# Patient Record
Sex: Female | Born: 1970 | Race: White | Hispanic: No | Marital: Married | Smoking: Never smoker
Health system: Southern US, Community
[De-identification: ages and names within clinical notes are randomized; demographics above are authoritative.]

## PROBLEM LIST (undated history)

## (undated) DIAGNOSIS — J45909 Unspecified asthma, uncomplicated: Secondary | ICD-10-CM

## (undated) DIAGNOSIS — I8390 Asymptomatic varicose veins of unspecified lower extremity: Secondary | ICD-10-CM

## (undated) HISTORY — PX: ENDOVENOUS ABLATION SAPHENOUS VEIN W/ LASER: SUR449

## (undated) HISTORY — PX: WISDOM TOOTH EXTRACTION: SHX21

---

## 2018-03-05 ENCOUNTER — Emergency Department (INDEPENDENT_AMBULATORY_CARE_PROVIDER_SITE_OTHER)
Admission: EM | Admit: 2018-03-05 | Discharge: 2018-03-05 | Disposition: A | Discharge status: Home / Self Care | Payer: BLUE CROSS/BLUE SHIELD | Source: Home / Self Care

## 2018-03-05 ENCOUNTER — Emergency Department (INDEPENDENT_AMBULATORY_CARE_PROVIDER_SITE_OTHER): Payer: BLUE CROSS/BLUE SHIELD

## 2018-03-05 ENCOUNTER — Encounter: Payer: Self-pay | Admitting: *Deleted

## 2018-03-05 ENCOUNTER — Other Ambulatory Visit: Payer: Self-pay

## 2018-03-05 DIAGNOSIS — S8001XA Contusion of right knee, initial encounter: Secondary | ICD-10-CM | POA: Diagnosis not present

## 2018-03-05 DIAGNOSIS — M25561 Pain in right knee: Secondary | ICD-10-CM

## 2018-03-05 HISTORY — DX: Asymptomatic varicose veins of unspecified lower extremity: I83.90

## 2018-03-05 HISTORY — DX: Unspecified asthma, uncomplicated: J45.909

## 2018-03-05 NOTE — Discharge Instructions (Signed)
See your Orthopaedist for recheck if pain persist past one week.  Ice to area of swelling.  Elevate.

## 2018-03-05 NOTE — ED Triage Notes (Signed)
Pt c/o RT knee injury post fall today at 1300. Last dose IBF at 1830 today.

## 2018-03-06 NOTE — ED Provider Notes (Signed)
[facility]    CSN: [id number] Arrival date & time: 03/05/18  1845     History   Chief Complaint Chief Complaint  Patient presents with  . Knee Injury    HPI [facility] is a 47 y.o. female.   The history is provided by the patient. No language interpreter was used.  Fall  This is a new problem. The current episode started 1 to 2 hours ago. The problem occurs constantly. The problem has not changed since onset.The symptoms are aggravated by walking. Nothing relieves the symptoms. She has tried nothing for the symptoms. The treatment provided no relief.  Pt fell. Pt reports she hit her right knee and both hands.  Pt complains of severe pain to right knee,  Pt is bruised and swollen.  Pt scraped up both hands.  Pt reports hands are sore   Past Medical History:  Diagnosis Date  . Asthma   . Varicose vein of leg     There are no active problems to display for this patient.   Past Surgical History:  Procedure Laterality Date  . CESAREAN SECTION    . ENDOVENOUS ABLATION SAPHENOUS VEIN W/ LASER    . WISDOM TOOTH EXTRACTION      OB History   None      Home Medications    Prior to Admission medications   Medication Sig Start Date End Date Taking? Authorizing Provider  cetirizine (ZYRTEC) 10 MG tablet Take 10 mg by mouth daily.   Yes [provider]  Cholecalciferol (VITAMIN D PO) Take by mouth.   Yes [provider]  Omega-3 Fatty Acids (FISH OIL) 1000 MG CAPS Take by mouth.   Yes [provider]  diclofenac (VOLTAREN) 75 MG EC tablet  02/25/18   [provider]  montelukast (SINGULAIR) 10 MG tablet  02/03/18   [provider]    Family History Family History  Problem Relation Age of Onset  . Cancer Mother        breast  . Diabetes Mother   . Heart failure Mother   . Cancer Sister        breast  . Diabetes Sister     Social History Social History   Tobacco Use  . Smoking status: Never Smoker  .  Smokeless tobacco: Never Used  Substance Use Topics  . Alcohol use: Never    Frequency: Never  . Drug use: Never     Allergies   Ceftin [cefuroxime] and Sulfa antibiotics   Review of Systems Review of Systems  All other systems reviewed and are negative.    Physical Exam Triage Vital Signs ED Triage Vitals  Enc Vitals Group     BP 03/05/18 1907 130/84     Pulse Rate 03/05/18 1907 88     Resp 03/05/18 1907 18     Temp 03/05/18 1907 98.5 F (36.9 C)     Temp Source 03/05/18 1907 Oral     SpO2 03/05/18 1907 98 %     Weight 03/05/18 1908 268 lb (121.6 kg)     Height 03/05/18 1908 5' 4.5" (1.638 m)     Head Circumference --      Peak Flow --      Pain Score 03/05/18 1908 5     Pain Loc --      Pain Edu? --      Excl. in GC? --    No data found.  Updated Vital Signs BP 130/84 (BP Location:  Right Arm)   Pulse 88   Temp 98.5 F (36.9 C) (Oral)   Resp 18   Ht 5' 4.5" (1.638 m)   Wt 268 lb (121.6 kg)   LMP 02/09/2018   SpO2 98%   BMI 45.29 kg/m   Visual Acuity Right Eye Distance:   Left Eye Distance:   Bilateral Distance:    Right Eye Near:   Left Eye Near:    Bilateral Near:     Physical Exam  Constitutional: She appears well-developed and well-nourished.  HENT:  Head: Normocephalic.  Right Ear: External ear normal.  Left Ear: External ear normal.  Nose: Nose normal.  Mouth/Throat: Oropharynx is clear and moist.  Eyes: Pupils are equal, round, and reactive to light. Conjunctivae are normal.  Cardiovascular: Normal rate and regular rhythm.  Pulmonary/Chest: Effort normal.  Abdominal: Soft.  Musculoskeletal: She exhibits tenderness and deformity.  Bruised swollen right knee,  Pain with movement, nv and ns intact   Neurological: She is alert.  Skin: Skin is warm.  Psychiatric: She has a normal mood and affect.  Nursing note and vitals reviewed.    UC Treatments / Results  Labs (all labs ordered are listed, but only abnormal results are  displayed) Labs Reviewed - No data to display  EKG None Radiology Dg Knee Complete 4 Views Right  Result Date: 03/05/2018 CLINICAL DATA:  Trip and fall with right knee pain, initial encounter EXAM: RIGHT KNEE - COMPLETE 4+ VIEW COMPARISON:  None. FINDINGS: Mild degenerative changes are noted in the medial joint space. No acute fracture or dislocation is noted. No soft tissue changes are seen. IMPRESSION: Mild degenerative change without acute abnormality. Electronically Signed   By: Alcide Clever M.D.   On: 03/05/2018 19:39    Procedures Procedures (including critical care time)  Medications Ordered in UC Medications - No data to display   Initial Impression / Assessment and Plan / UC Course  I have reviewed the triage vital signs and the nursing notes.  Pertinent labs & imaging results that were available during my care of the patient were reviewed by me and considered in my medical decision making (see chart for details).     MDM  Xray no fracture,  Pt counseled on results   Final Clinical Impressions(s) / UC Diagnoses   Final diagnoses:  Contusion of right knee, initial encounter    ED Discharge Orders    None     Pt placed in a knee sleeve.    Controlled Substance Prescriptions [place] Controlled Substance Registry consulted? Not Applicable   [place], [place] 03/06/18 1558

## 2018-03-07 ENCOUNTER — Telehealth: Payer: Self-pay | Admitting: *Deleted

## 2018-03-07 NOTE — Telephone Encounter (Signed)
Pt called requesting work note. Per Dr Georgina PillionMassey ok to excuse from work return Saturday. Pt gave verbal permission for her husband to pick up her work note.

## 2018-09-27 IMAGING — DX DG KNEE COMPLETE 4+V*R*
4 series · 4 of 4 positions shown · non-contrast
Comparison: None.

CLINICAL DATA: Trip and fall with right knee pain, initial
encounter

EXAM:
RIGHT KNEE - COMPLETE 4+ VIEW

[knee ap]
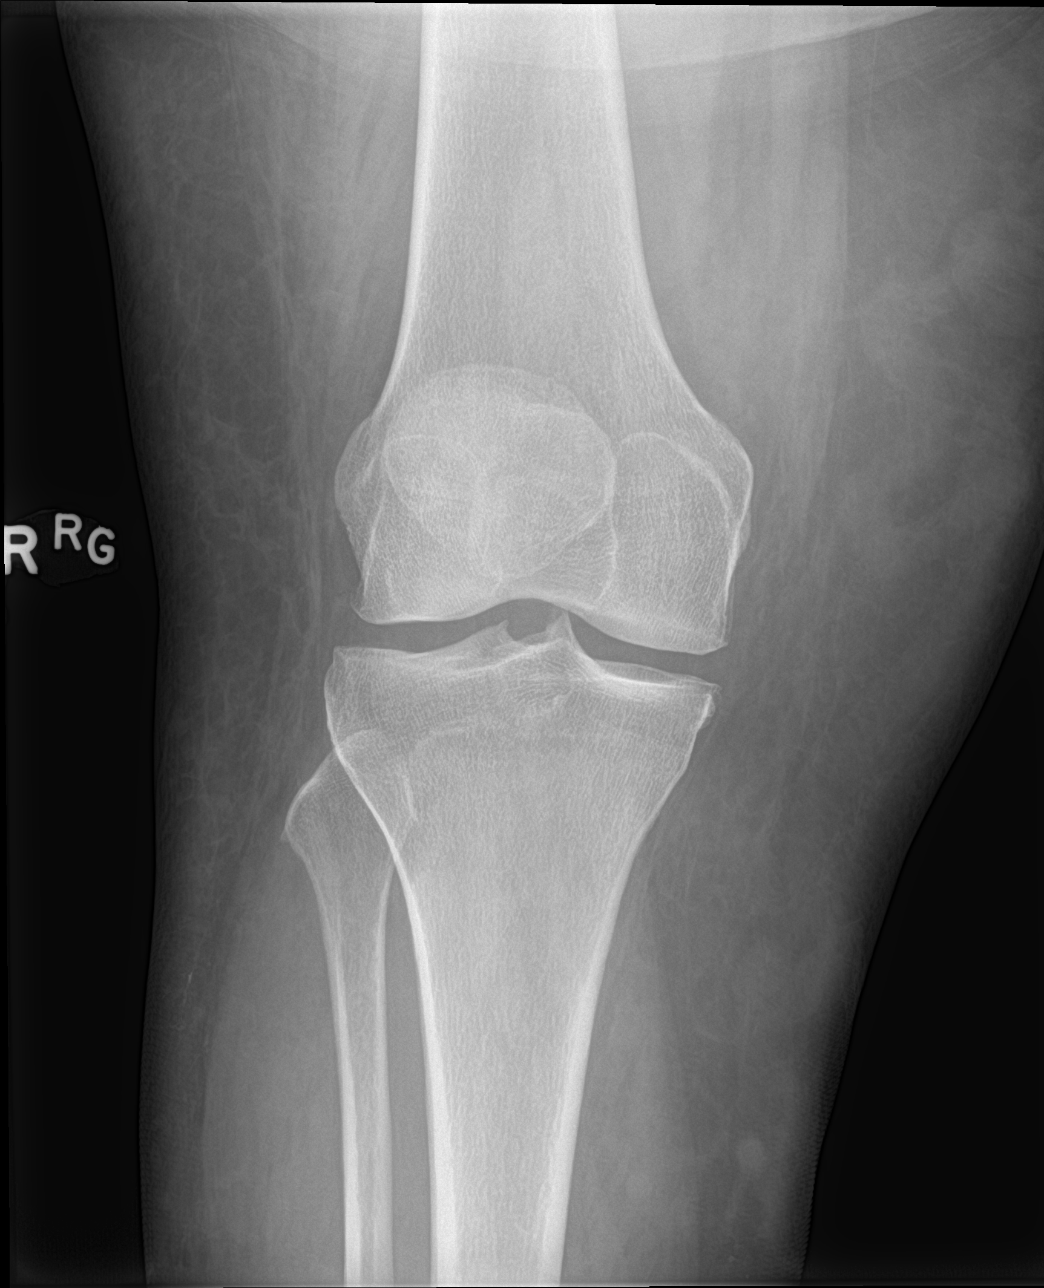

[knee lat]
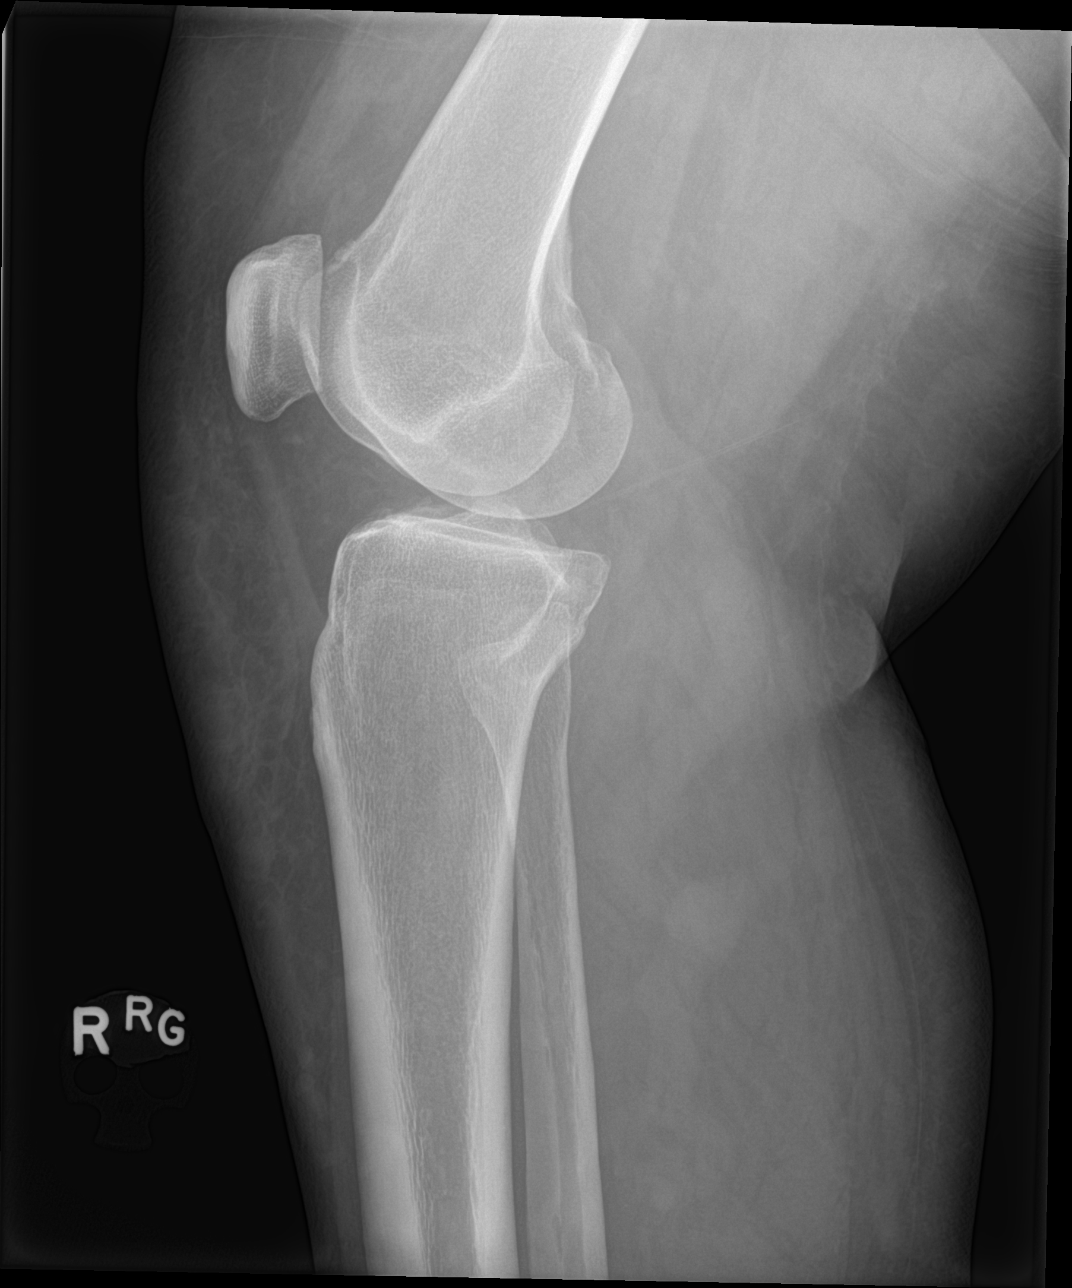

[knee obl (1 of 2)]
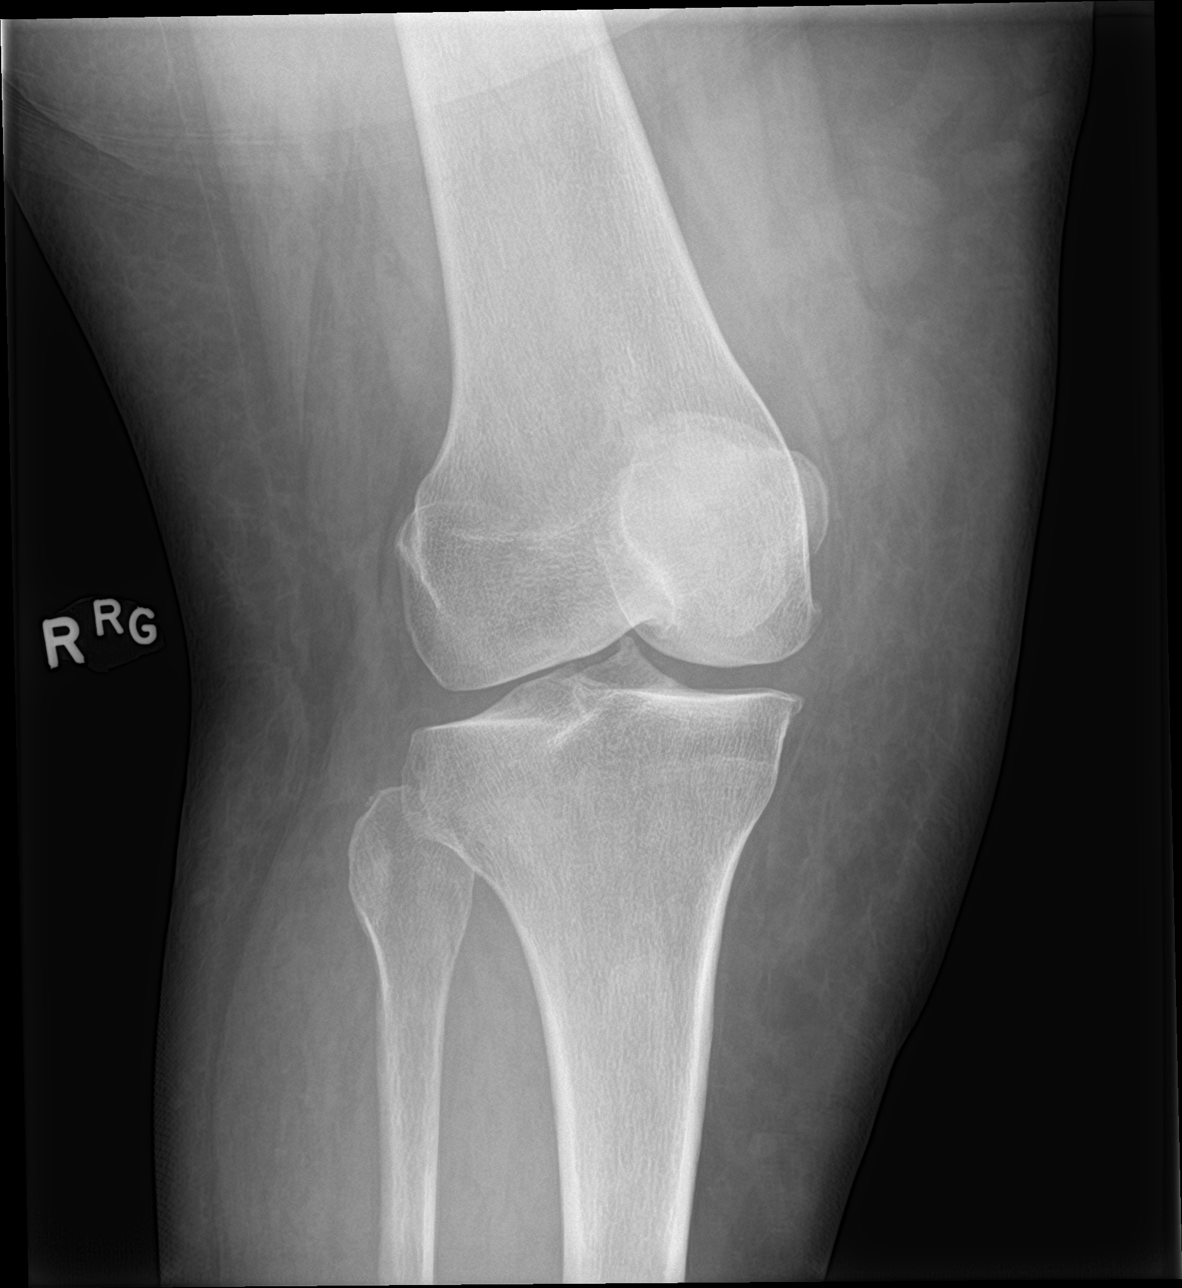

[knee obl (2 of 2)]
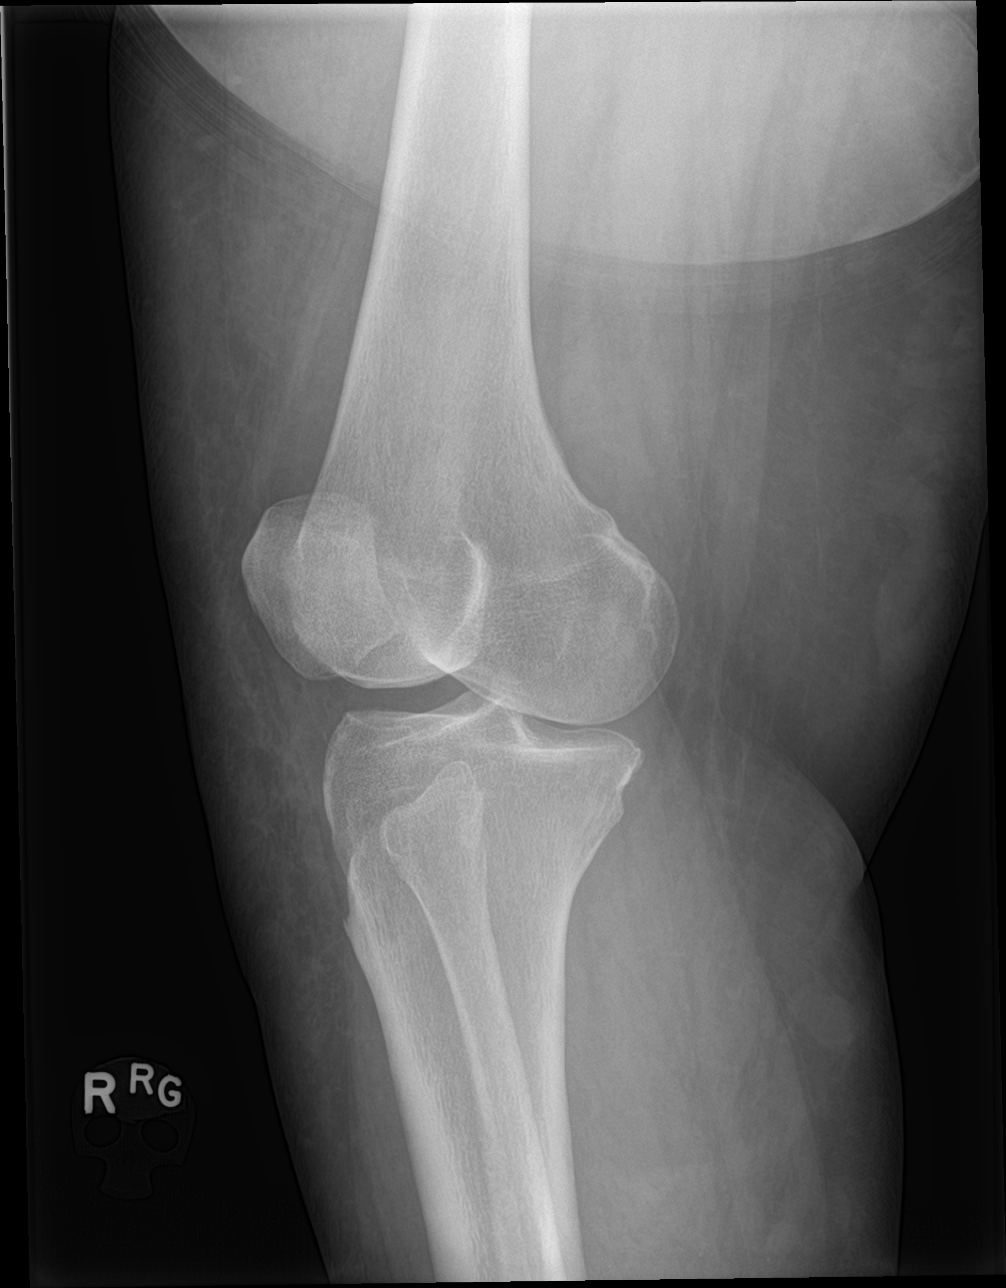

[4 of 4 positions shown; findings below may reference images not displayed]

FINDINGS: Mild degenerative changes are noted in the medial joint space. No
acute fracture or dislocation is noted. No soft tissue changes are
seen.
IMPRESSION: Mild degenerative change without acute abnormality.
